# Patient Record
Sex: Female | Born: 1955 | Race: White | Hispanic: No | Marital: Married | Smoking: Current every day smoker
Health system: Southern US, Community
[De-identification: ages and names within clinical notes are randomized; demographics above are authoritative.]

## PROBLEM LIST (undated history)

## (undated) HISTORY — PX: OTHER SURGICAL HISTORY: SHX169

---

## 2001-08-24 HISTORY — PX: CORONARY ARTERY BYPASS GRAFT: SHX141

## 2004-09-09 ENCOUNTER — Encounter: Payer: Self-pay | Admitting: Cardiovascular Disease

## 2004-09-24 ENCOUNTER — Encounter: Payer: Self-pay | Admitting: Cardiovascular Disease

## 2013-08-24 HISTORY — PX: CORONARY ANGIOPLASTY WITH STENT PLACEMENT: SHX49

## 2013-11-07 ENCOUNTER — Ambulatory Visit: Payer: Self-pay | Admitting: Cardiology

## 2013-11-07 LAB — CK-MB: CK-MB: <0.5 ng/mL — ABNORMAL LOW (ref 0.5–3.6)

## 2013-11-08 LAB — BASIC METABOLIC PANEL
Anion Gap: 4 — ABNORMAL LOW (ref 7–16)
BUN: 10 mg/dL (ref 7–18)
CALCIUM: 7.7 mg/dL — AB (ref 8.5–10.1)
Chloride: 105 mmol/L (ref 98–107)
Co2: 26 mmol/L (ref 21–32)
Creatinine: 0.54 mg/dL — ABNORMAL LOW (ref 0.60–1.30)
EGFR (African American): >60
GLUCOSE: 204 mg/dL — AB (ref 65–99)
Osmolality: 275 (ref 275–301)
Potassium: 3.8 mmol/L (ref 3.5–5.1)
SODIUM: 135 mmol/L — AB (ref 136–145)

## 2013-11-08 LAB — CBC
HCT: 36.9 % (ref 35.0–47.0)
HGB: 12.6 g/dL (ref 12.0–16.0)
MCH: 28.7 pg (ref 26.0–34.0)
MCHC: 34.1 g/dL (ref 32.0–36.0)
MCV: 84 fL (ref 80–100)
Platelet: 216 10*3/uL (ref 150–440)
RBC: 4.38 10*6/uL (ref 3.80–5.20)
RDW: 14.1 % (ref 11.5–14.5)
WBC: 7.9 10*3/uL (ref 3.6–11.0)

## 2014-06-04 ENCOUNTER — Emergency Department: Payer: Self-pay | Admitting: Emergency Medicine

## 2014-06-04 LAB — CBC WITH DIFFERENTIAL/PLATELET
Basophil #: 0.1 10*3/uL (ref 0.0–0.1)
Basophil %: 0.9 %
Eosinophil #: 0.1 10*3/uL (ref 0.0–0.7)
Eosinophil %: 1.8 %
HCT: 43.2 % (ref 35.0–47.0)
HGB: 14.6 g/dL (ref 12.0–16.0)
LYMPHS ABS: 3.3 10*3/uL (ref 1.0–3.6)
LYMPHS PCT: 39.3 %
MCH: 28.4 pg (ref 26.0–34.0)
MCHC: 33.7 g/dL (ref 32.0–36.0)
MCV: 84 fL (ref 80–100)
MONOS PCT: 5.8 %
Monocyte #: 0.5 x10 3/mm (ref 0.2–0.9)
NEUTROS ABS: 4.4 10*3/uL (ref 1.4–6.5)
NEUTROS PCT: 52.2 %
Platelet: 271 10*3/uL (ref 150–440)
RBC: 5.13 10*6/uL (ref 3.80–5.20)
RDW: 15 % — ABNORMAL HIGH (ref 11.5–14.5)
WBC: 8.4 10*3/uL (ref 3.6–11.0)

## 2014-06-04 LAB — COMPREHENSIVE METABOLIC PANEL
ALBUMIN: 3.8 g/dL (ref 3.4–5.0)
Alkaline Phosphatase: 120 U/L — ABNORMAL HIGH
Anion Gap: 8 (ref 7–16)
BILIRUBIN TOTAL: 0.3 mg/dL (ref 0.2–1.0)
BUN: 13 mg/dL (ref 7–18)
Calcium, Total: 9 mg/dL (ref 8.5–10.1)
Chloride: 104 mmol/L (ref 98–107)
Co2: 26 mmol/L (ref 21–32)
Creatinine: 0.95 mg/dL (ref 0.60–1.30)
EGFR (Non-African Amer.): >60
Glucose: 274 mg/dL — ABNORMAL HIGH (ref 65–99)
OSMOLALITY: 286 (ref 275–301)
POTASSIUM: 3.7 mmol/L (ref 3.5–5.1)
SGOT(AST): 6 U/L — ABNORMAL LOW (ref 15–37)
SGPT (ALT): 20 U/L
Sodium: 138 mmol/L (ref 136–145)
TOTAL PROTEIN: 7.4 g/dL (ref 6.4–8.2)

## 2014-06-07 LAB — HEMOGLOBIN A1C: HEMOGLOBIN A1C: 12.5 % — AB (ref 4.0–6.0)

## 2014-06-07 LAB — BASIC METABOLIC PANEL
BUN: 11 mg/dL (ref 4–21)
Creatinine: 0.6 mg/dL (ref 0.5–1.1)

## 2014-12-15 NOTE — Discharge Summary (Signed)
PATIENT NAME:  Cathy Robinson, [name] MR#:  [id number] DATE OF BIRTH:  10/08/1955  DATE OF ADMISSION:  11/07/2013 DATE OF DISCHARGE:  11/08/2013  PRIMARY CARE PHYSICIAN: Dr. Welton FlakesKhan.   FINAL DIAGNOSES: 1. Coronary artery disease.  2. Diabetes.   DISCHARGE MEDICATIONS: Aspirin 325 mg daily, clopidogrel 75 mg daily, Imdur 30 mg daily, metformin 1000 mg b.i.d., Nitrostat p.r.n.   PROCEDURES: 1. Cardiac catheterization with selective coronary arteriography on 11/07/2013.  2. Percutaneous coronary intervention with coronary stent.   HISTORY OF PRESENT ILLNESS: Please see admission H and P.   HOSPITAL COURSE: The patient underwent elective cardiac catheterization for symptoms of unstable angina. Coronary arteriography revealed a high-grade stenosis in the saphenous vein bypass graft to the distal right coronary artery. The patient underwent percutaneous coronary intervention receiving a 3.5 x 18 mm Xience EX stent with an excellent angiographic result. Postprocedural CPK-MB was less than 0.5. On the morning of 11/08/2013 the patient was ambulating without difficulty and was discharged home in stable condition. The patient is scheduled to follow up with Dr. Adrian BlackwaterShaukat Khan in 1 to 2 weeks.  _______ Marcina MillardAlexander Lamichael Youkhana, MD ap:sg D: 11/08/2013 07:34:06 ET T: 11/08/2013 11:13:31 ET JOB#: [id number]  cc: Marcina MillardAlexander Shawan Corella, MD, <Dictator> Marcina MillardALEXANDER Silas Muff MD ELECTRONICALLY SIGNED 11/14/2013 12:45

## 2015-05-29 ENCOUNTER — Encounter: Payer: Self-pay | Admitting: Internal Medicine

## 2015-08-03 ENCOUNTER — Other Ambulatory Visit: Payer: Self-pay | Admitting: Internal Medicine

## 2015-08-03 ENCOUNTER — Encounter: Payer: Self-pay | Admitting: Internal Medicine

## 2015-08-03 DIAGNOSIS — M25519 Pain in unspecified shoulder: Secondary | ICD-10-CM | POA: Insufficient documentation

## 2015-08-03 DIAGNOSIS — A6000 Herpesviral infection of urogenital system, unspecified: Secondary | ICD-10-CM | POA: Insufficient documentation

## 2015-08-03 DIAGNOSIS — E782 Mixed hyperlipidemia: Secondary | ICD-10-CM | POA: Insufficient documentation

## 2015-08-03 DIAGNOSIS — Q245 Malformation of coronary vessels: Secondary | ICD-10-CM | POA: Insufficient documentation

## 2015-08-03 DIAGNOSIS — E1151 Type 2 diabetes mellitus with diabetic peripheral angiopathy without gangrene: Secondary | ICD-10-CM | POA: Insufficient documentation

## 2015-08-03 DIAGNOSIS — I739 Peripheral vascular disease, unspecified: Secondary | ICD-10-CM | POA: Insufficient documentation

## 2015-08-03 DIAGNOSIS — G43409 Hemiplegic migraine, not intractable, without status migrainosus: Secondary | ICD-10-CM | POA: Insufficient documentation

## 2015-10-30 IMAGING — CT CT HEAD WITHOUT CONTRAST
1 series · 16 of 30 positions shown, 20 images · non-contrast
Comparison: None.

CLINICAL DATA: Posterior headache. Difficulty controlling the
extremities.

EXAM:
CT HEAD WITHOUT CONTRAST
TECHNIQUE: Contiguous axial images were obtained from the base of the skull
through the vertex without intravenous contrast.

[Series 2: head wo · axial · 0.45mm/px · z∈[-151,-25]mm · 16 of 32 slices shown, 20 images]
[im 2/32  brain]
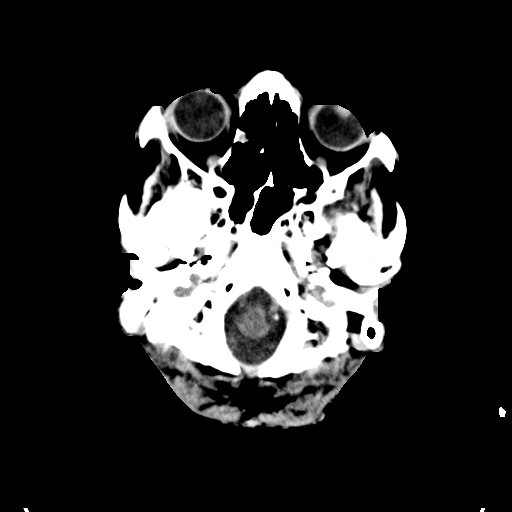
[im 2/32  bone]
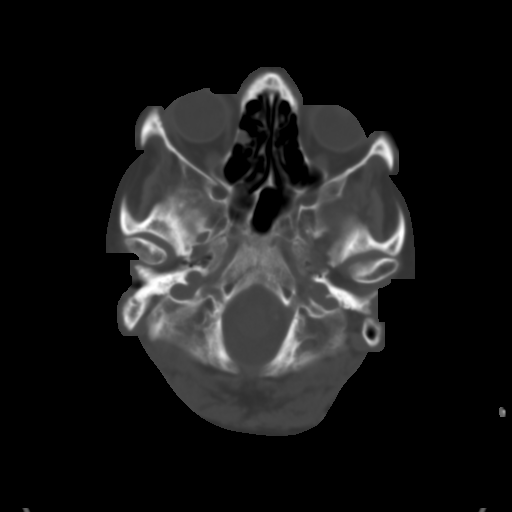
[im 4/32  brain]
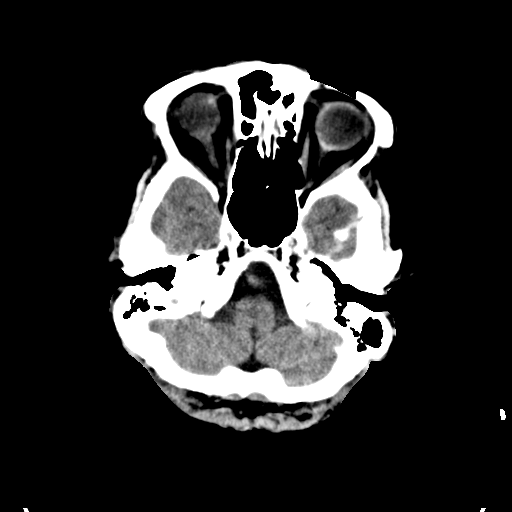
[im 6/32  brain]
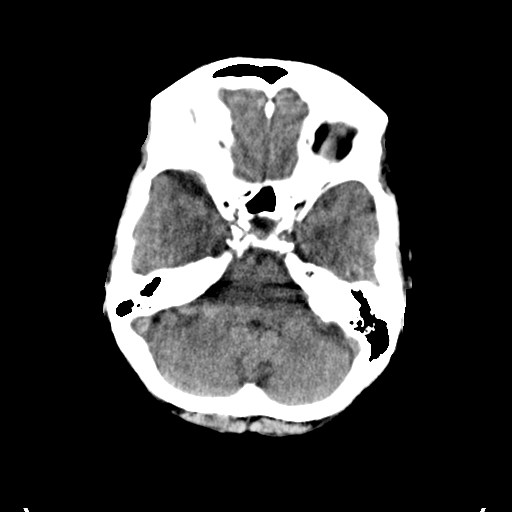
[im 8/32  brain]
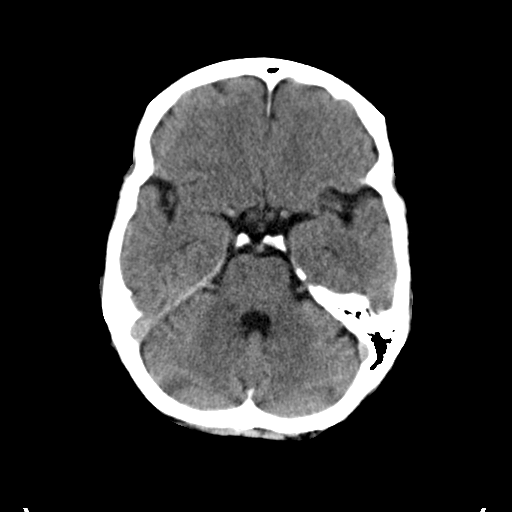
[im 9/32  brain]
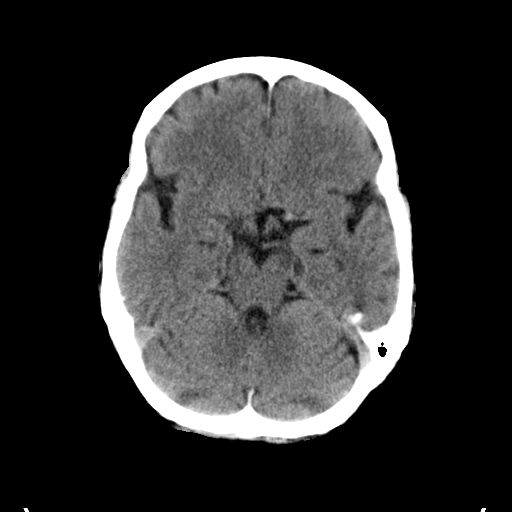
[im 9/32  bone]
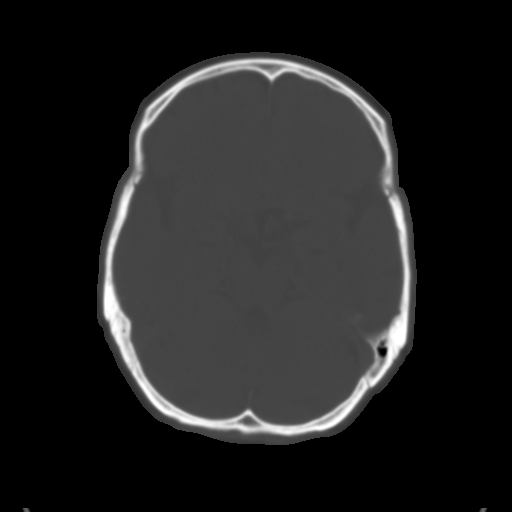
[im 11/32  brain]
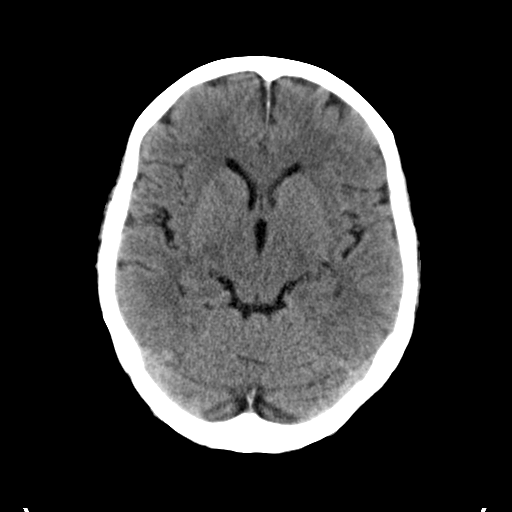
[im 13/32  brain]
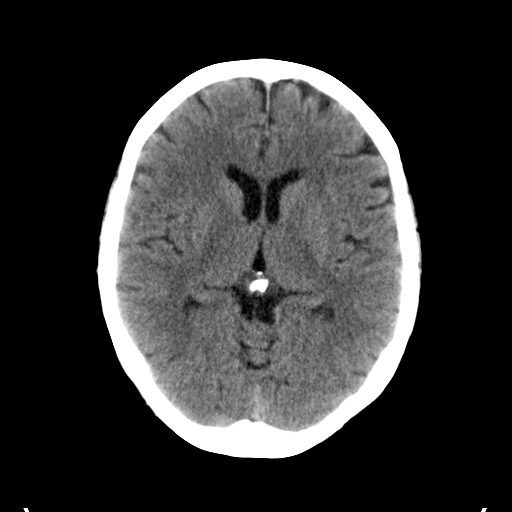
[im 15/32  brain]
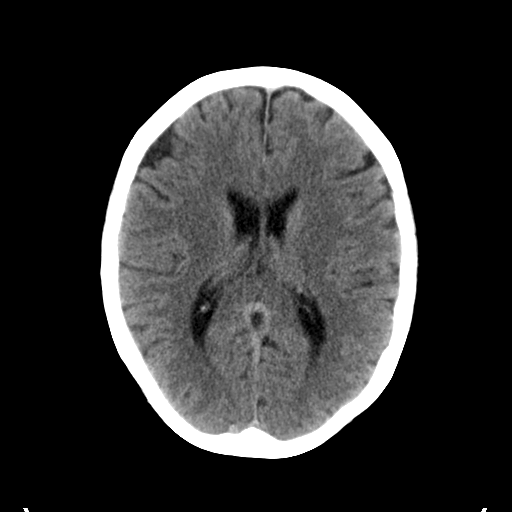
[im 17/32  brain]
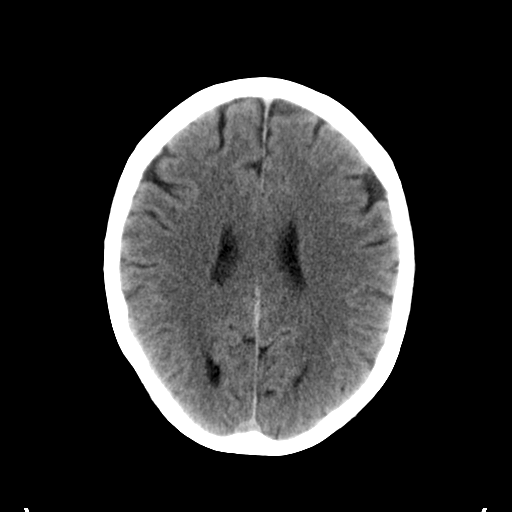
[im 17/32  bone]
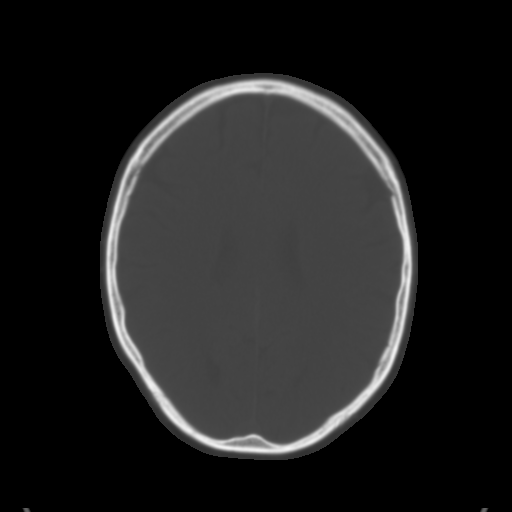
[im 19/32  brain]
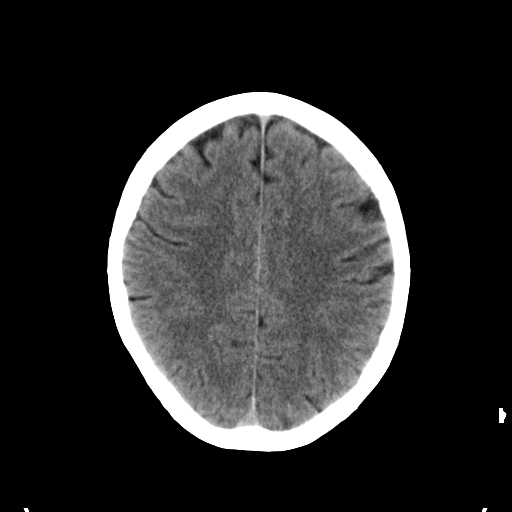
[im 21/32  brain]
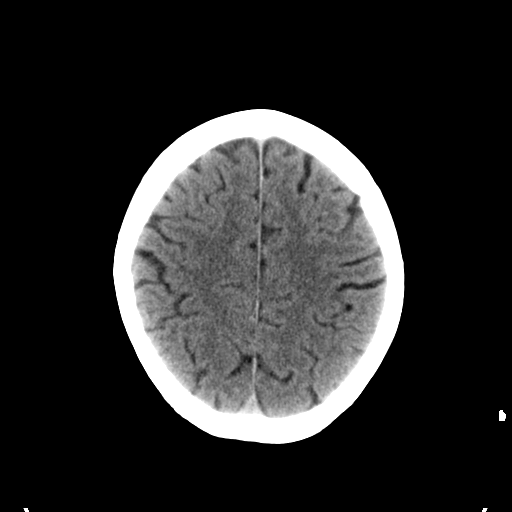
[im 23/32  brain]
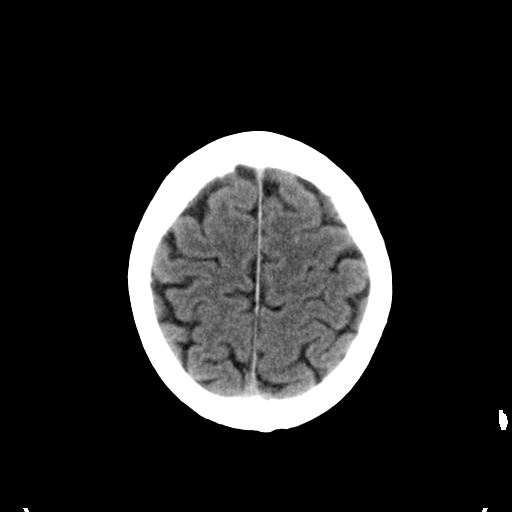
[im 24/32  brain]
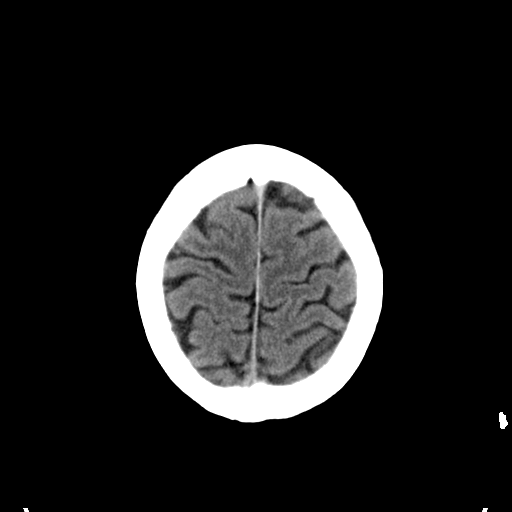
[im 24/32  bone]
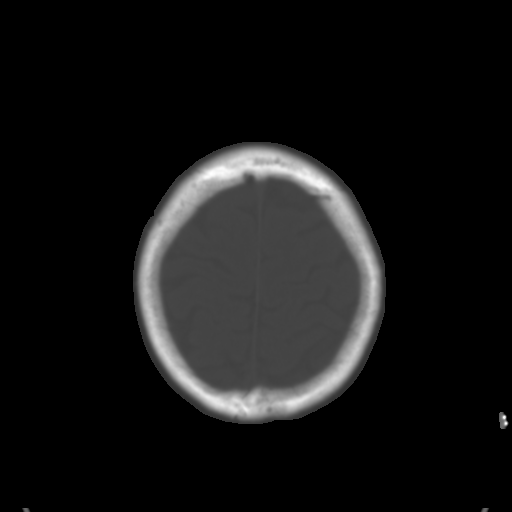
[im 26/32  brain]
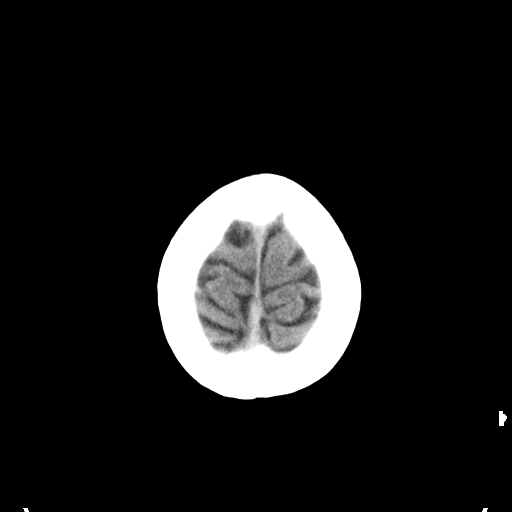
[im 28/32  brain]
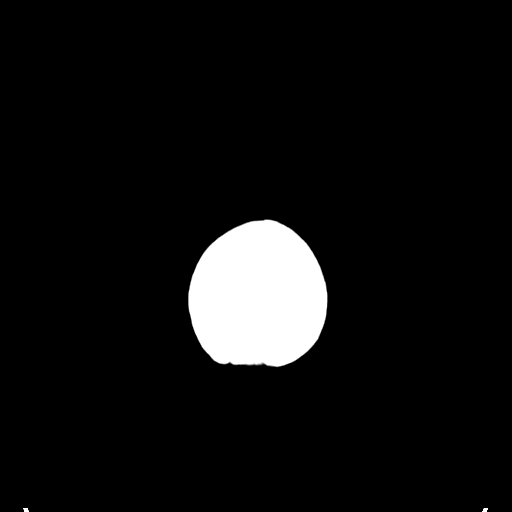
[im 30/32  brain]
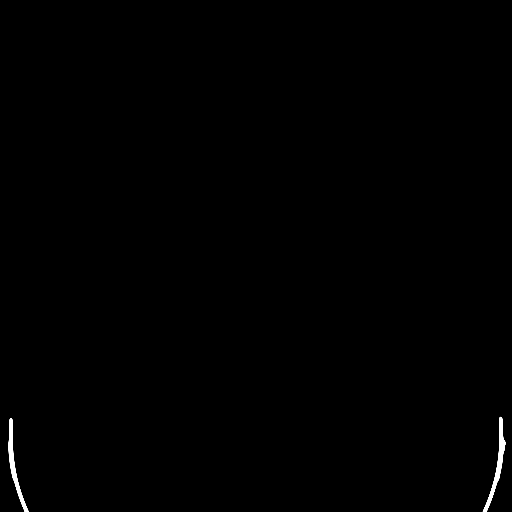

[16 of 30 positions shown; findings below may reference images not displayed]

FINDINGS: The brainstem, cerebellum, cerebral peduncles, thalamus, basal
ganglia, basilar cisterns, and ventricular system appear within
normal limits. No intracranial hemorrhage, mass lesion, or acute
CVA.

There is opacification of a right-sided ethmoid air cell compatible
with chronic mild right ethmoid sinusitis.

There is atherosclerotic calcification of the cavernous carotid
arteries bilaterally.
IMPRESSION: 1. Mild chronic right maxillary sinusitis. No acute intracranial
findings.

## 2019-11-22 ENCOUNTER — Other Ambulatory Visit: Payer: Self-pay

## 2019-11-22 ENCOUNTER — Other Ambulatory Visit: Payer: Self-pay | Admitting: Family Medicine

## 2021-12-22 DEATH — deceased
